# Patient Record
Sex: Female | Born: 1980
Health system: Southern US, Community
[De-identification: ages and names within clinical notes are randomized; demographics above are authoritative.]

## PROBLEM LIST (undated history)

## (undated) DIAGNOSIS — F329 Major depressive disorder, single episode, unspecified: Secondary | ICD-10-CM

## (undated) DIAGNOSIS — Z9189 Other specified personal risk factors, not elsewhere classified: Secondary | ICD-10-CM

## (undated) DIAGNOSIS — R51 Headache: Secondary | ICD-10-CM

## (undated) DIAGNOSIS — B009 Herpesviral infection, unspecified: Secondary | ICD-10-CM

## (undated) DIAGNOSIS — G43909 Migraine, unspecified, not intractable, without status migrainosus: Secondary | ICD-10-CM

## (undated) DIAGNOSIS — F32A Depression, unspecified: Secondary | ICD-10-CM

## (undated) DIAGNOSIS — R519 Headache, unspecified: Secondary | ICD-10-CM

## (undated) HISTORY — DX: Other specified personal risk factors, not elsewhere classified: Z91.89

## (undated) HISTORY — DX: Headache, unspecified: R51.9

## (undated) HISTORY — DX: Herpesviral infection, unspecified: B00.9

## (undated) HISTORY — DX: Major depressive disorder, single episode, unspecified: F32.9

## (undated) HISTORY — DX: Depression, unspecified: F32.A

## (undated) HISTORY — DX: Migraine, unspecified, not intractable, without status migrainosus: G43.909

## (undated) HISTORY — DX: Headache: R51

---

## 2006-05-16 ENCOUNTER — Ambulatory Visit: Payer: Self-pay | Admitting: Obstetrics and Gynecology

## 2006-05-16 ENCOUNTER — Inpatient Hospital Stay (HOSPITAL_COMMUNITY): Admission: AD | Admit: 2006-05-16 | Discharge: 2006-05-16 | Payer: Self-pay | Admitting: Family Medicine

## 2006-06-14 ENCOUNTER — Ambulatory Visit (HOSPITAL_COMMUNITY): Admission: RE | Admit: 2006-06-14 | Discharge: 2006-06-14 | Payer: Self-pay | Admitting: Obstetrics & Gynecology

## 2006-07-15 ENCOUNTER — Inpatient Hospital Stay (HOSPITAL_COMMUNITY): Admission: RE | Admit: 2006-07-15 | Discharge: 2006-07-18 | Payer: Self-pay | Admitting: Obstetrics & Gynecology

## 2007-08-08 IMAGING — US US OB COMP +14 WK
1 series · 13 of 28 positions shown · non-contrast
Comparison: none

CLINICAL DATA: 30 week 6 day gestational age by LMP.  No prenatal care.  Abdominal and pelvic pain.

[Series 1: us ob comp +14 wk · 0.30mm/px · 13 of 48 slices shown]
[im 2/48]
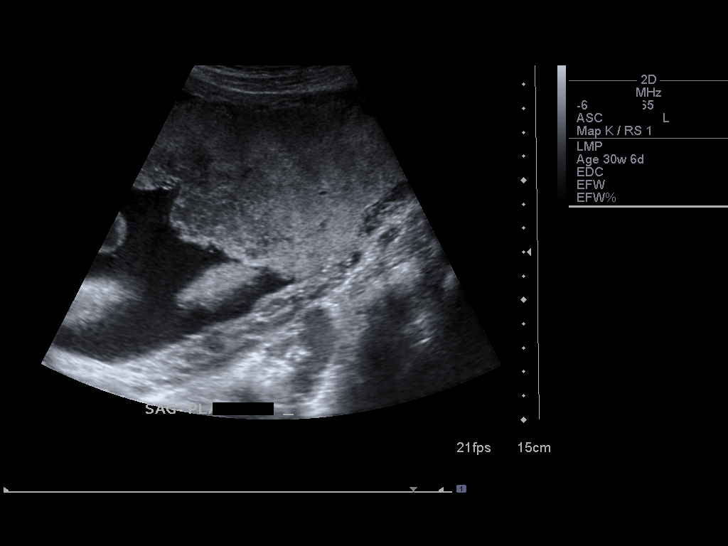
[im 6/48]
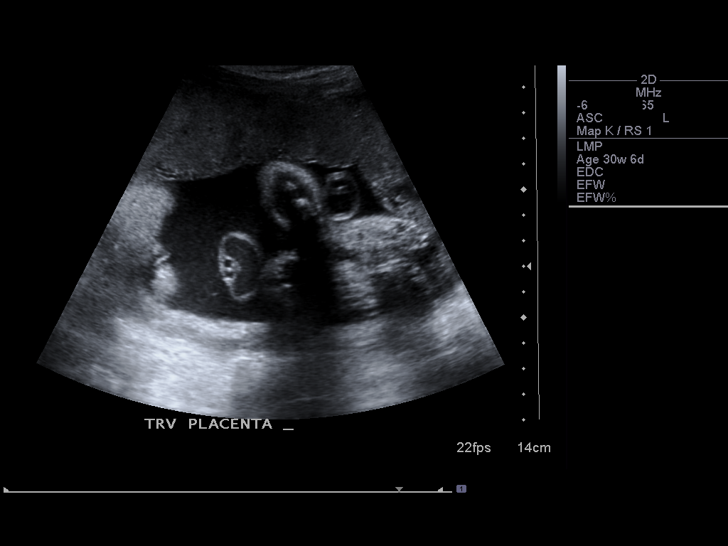
[im 9/48]
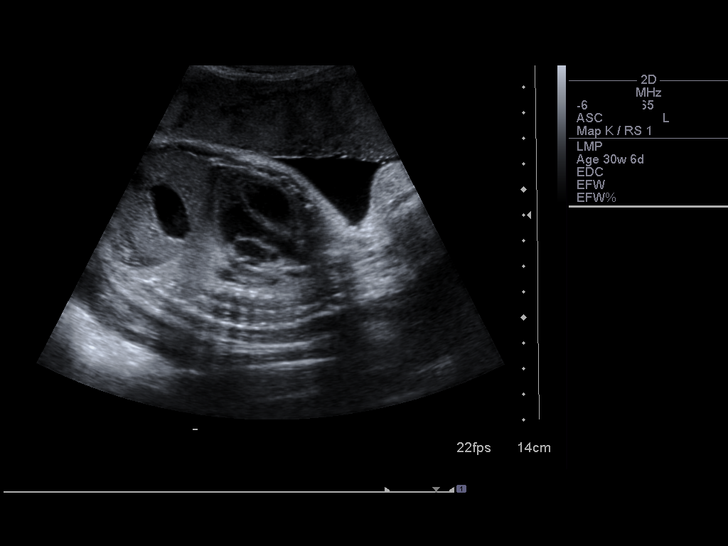
[im 13/48]
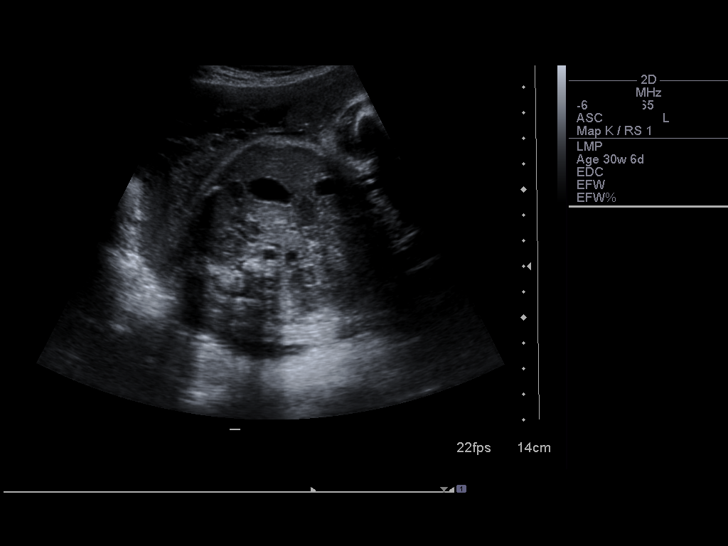
[im 16/48]
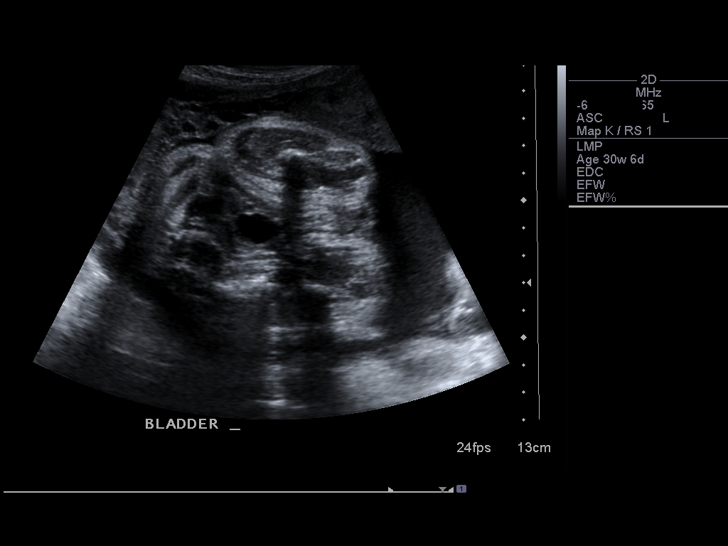
[im 20/48]
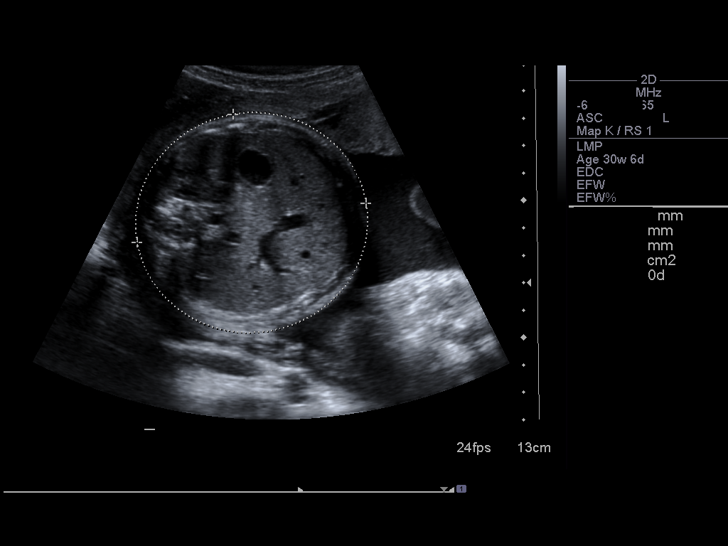
[im 25/48]
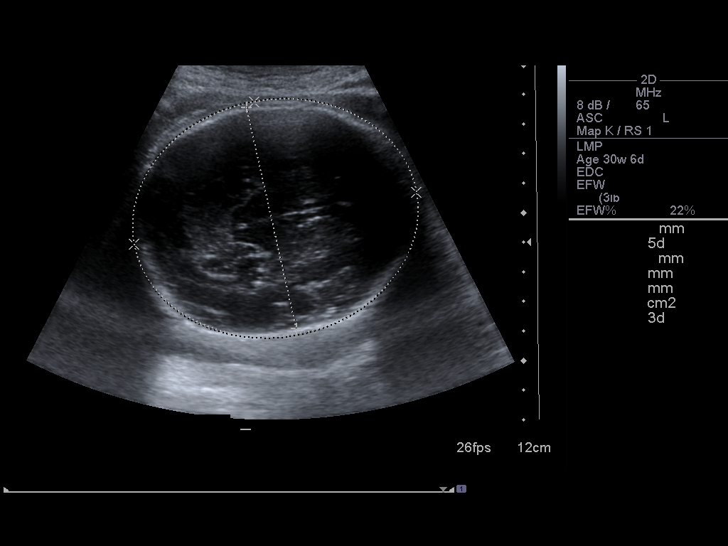
[im 28/48]
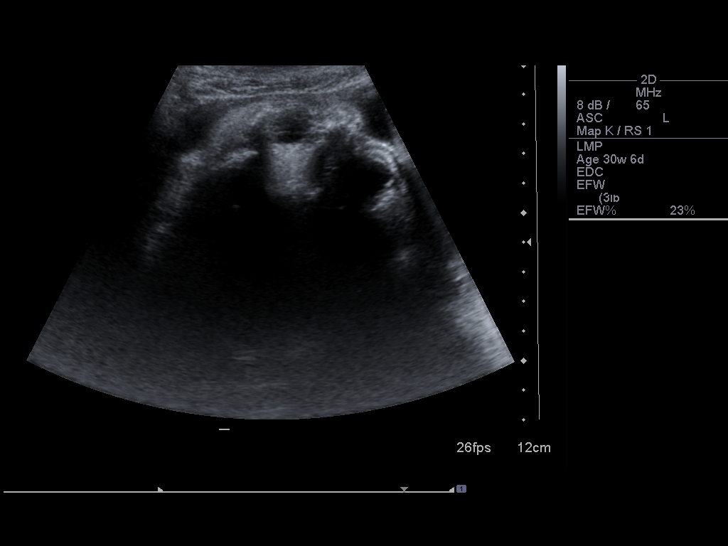
[im 32/48]
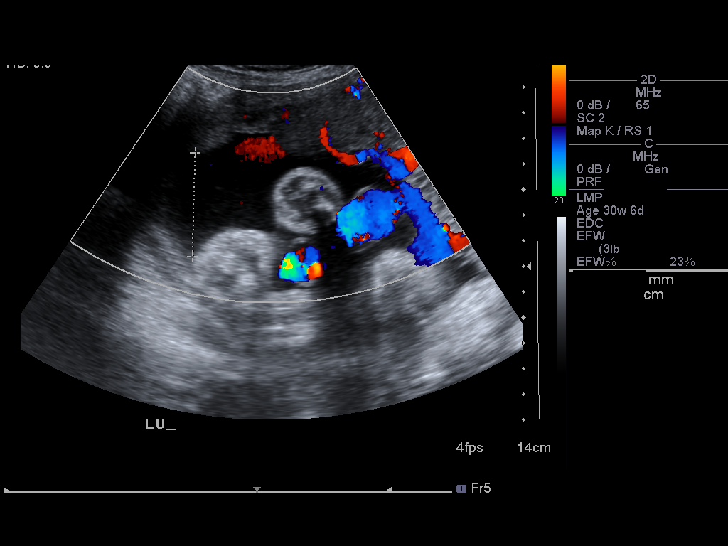
[im 35/48]
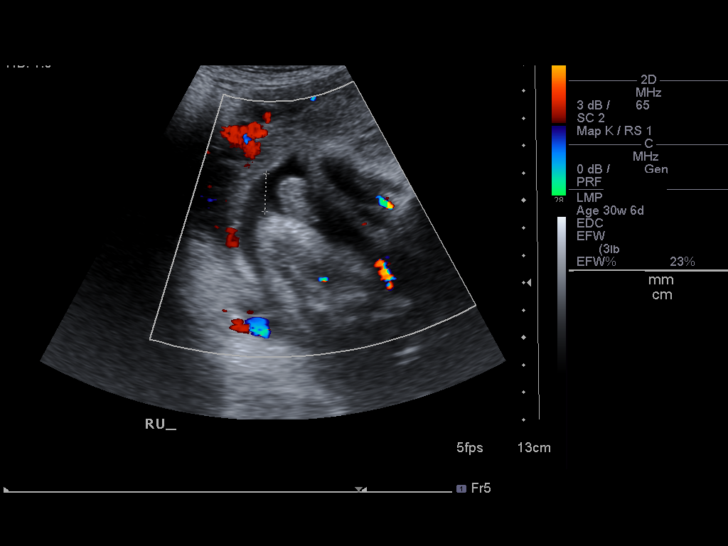
[im 39/48]
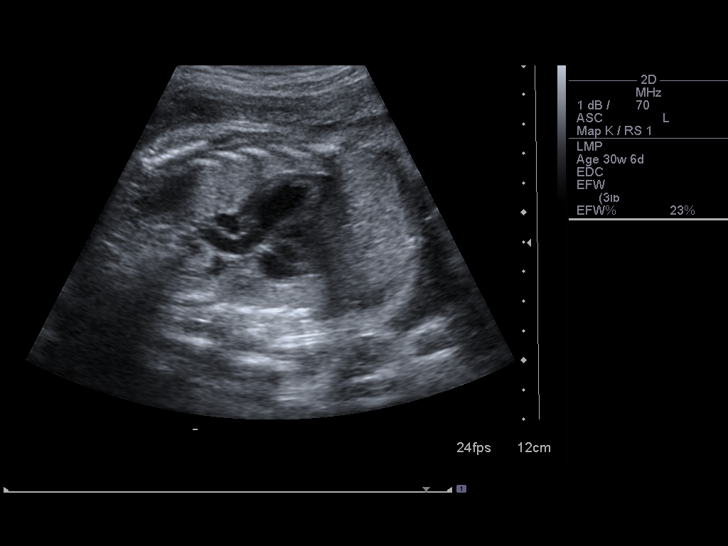
[im 42/48]
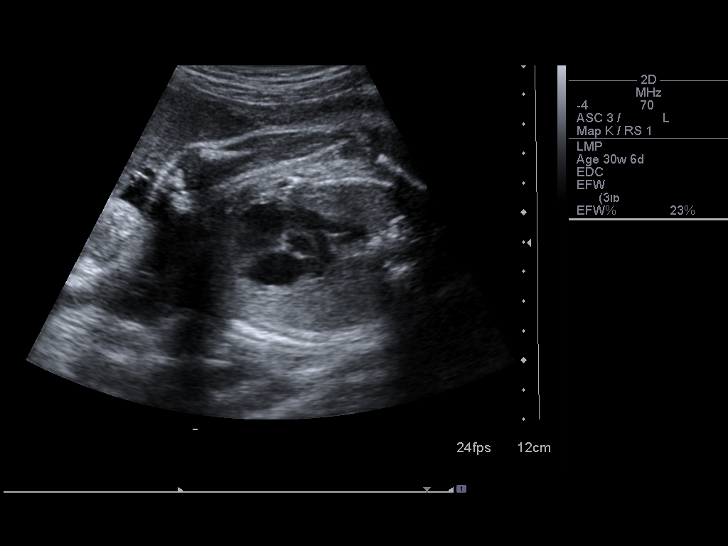
[im 46/48]
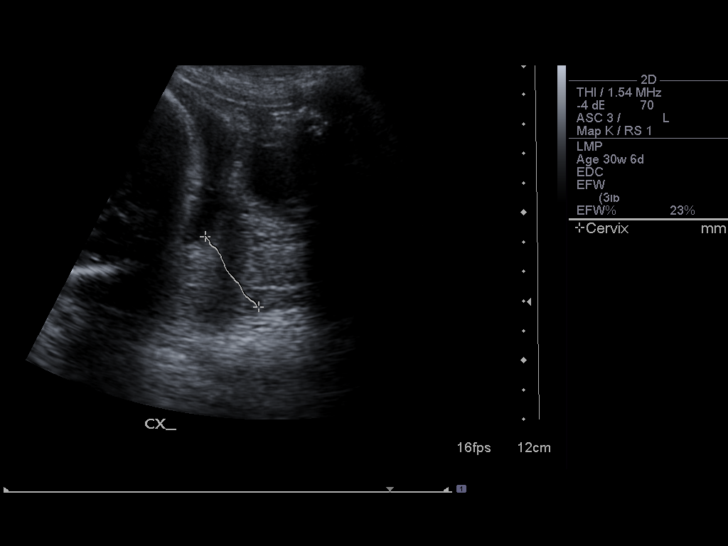

[13 of 28 positions shown; findings below may reference images not displayed]

OBSTETRICAL ULTRASOUND:
 Number of Fetuses:  1
 Heart Rate:  152 bpm
 Movement:  Yes
 Breathing:  Yes
 Presentation:  Cephalic
 Placental Location:  Anterior
 Grade:  I
 Previa:  No
 Amniotic Fluid (Subjective):  Normal
 Amniotic Fluid (Objective):  AFI 13.9 cm (9th-29th %ile = 8.8 to 23.8 cm for 31 weeks) 

 FETAL BIOMETRY
 BPD:  7.7 cm   30 w 6 d 
 HC:  27.9 cm   30 w 4 d 
 AC:  26.0 cm  30 w 1 d 
 FL:  5.8 cm     30 w 4 d 

 MEAN GA:  30 w 4 d   US EDC:  07/21/06

 EFW:  0881 grams 25th ? 50th %ile (3335 ? 1901 g) for 31 weeks 

 FETAL ANATOMY
 Lateral Ventricles:  Visualized 
 Thalami/CSP:  Visualized 
 Posterior Fossa:  Visualized 
 Nuchal Region:  Visualized 
 Spine:  Not visualized 
 4 Chamber Heart on Left:  Visualized 
 Stomach on Left:  Visualized 
 3 Vessel Cord:  Visualized 
 Cord Insertion site:  Visualized 
 Kidneys:  Visualized 
 Bladder:  Visualized 
 Extremities:  Not visualized 

 ADDITIONAL ANATOMY VISUALIZED:  LVOT, RVOT, upper lip, orbits, and diaphragm.  

 Evaluation limited by:  Advanced gestational age and fetal position.

 MATERNAL UTERINE AND ADNEXAL FINDINGS
 Cervix:  3.1 cm transabdominal.
IMPRESSION: 1.  Single living intrauterine fetus with mean gestational age of 30 weeks 4 days and US EDC of 07/21/06.  This is concordant with LMP.  
 2.  Normal amniotic fluid volume.  Normal cervical length.  
 3.  Limited anatomic evaluation due to advanced gestational age; however, no fetal abnormalities identified.

## 2010-02-12 ENCOUNTER — Emergency Department (HOSPITAL_COMMUNITY): Admission: EM | Admit: 2010-02-12 | Discharge: 2010-02-12 | Payer: Self-pay | Admitting: Emergency Medicine

## 2010-06-04 ENCOUNTER — Encounter: Payer: Self-pay | Admitting: Obstetrics & Gynecology

## 2010-09-29 NOTE — Op Note (Signed)
NAMEMarland Kitchen  Ann Rollins, Ann Rollins          ACCOUNT NO.:  [id number]   MEDICAL RECORD NO.:  [id number]          PATIENT TYPE:  INP   LOCATION:  9124                          FACILITY:  [facility]   PHYSICIAN:  Roseanna Rainbow, M.D.DATE OF BIRTH:  11-12-1980   DATE OF PROCEDURE:  07/15/2006  DATE OF DISCHARGE:                               OPERATIVE REPORT   PREOPERATIVE DIAGNOSES:  1. Intrauterine pregnancy at term.  2. History of previous cesarean delivery.   POSTOPERATIVE DIAGNOSES:  1. Intrauterine pregnancy at term.  2. History of previous cesarean delivery.   PROCEDURE:  Repeat cesarean delivery and revision of scar.   SURGEONS:  1. Roseanna Rainbow, M.D.  2. Charles A. Clearance Coots, M.D.   ANESTHESIA:  Spinal.   ESTIMATED BLOOD LOSS:  600 mL.   COMPLICATIONS:  None.   PROCEDURE:  The patient was taken to the operating room with an IV  running.  A spinal anesthetic was placed without difficulty.  She was  then placed in the dorsal supine position with a leftward tilt and  prepped and draped in the usual sterile fashion.  A Pfannenstiel skin  incision was then made through the previous scar with a scalpel and  carried down to the underlying fascia.  The fascia was incised along the  length of the incision with the scalpel.  The superior aspect of the  fascial incision was tented up and the underlying rectus muscles  dissected off.  The inferior aspect of the fascial incision was  manipulated in a similar fashion.  The rectus muscles were separated in  the midline.  The parietal peritoneum was entered sharply and the  peritoneal incision was extended superiorly and inferiorly with good  visualization of bladder.  At this point an Alexis a self-retaining  retractor was placed in the incision.  The vesicouterine peritoneum was  tented up and entered sharply with Metzenbaum scissors.  The bladder  flap was then created sharply.  The lower uterine segment was incised in  a  transverse fashion with the scalpel.  The uterine incision was then  extended bluntly.  The infant's head was delivered atraumatically.  The  oropharynx was suctioned with bulb suction.  Cord was clamped and cut  and the infant was handed off to the awaiting neonatologist.  The  placenta was then removed.  The intrauterine cavity was evacuated of any  remaining amniotic fluid, clots and debris with moistened laparotomy  sponge.  The uterine incision was then reapproximated in a running  interlocking fashion using 0 Monocryl.  A second imbricating layer of  the same was used to obtain adequate hemostasis.  The paracolic gutters  were irrigated.  The retractor was removed.  The parietal peritoneum was  reapproximated in a running fashion using 2-0 Vicryl.  The fascia was  reapproximated in a running fashion using 0 PDS.  At this point the  scarring involving the subcutaneous layer was undermined using the  Bovie.  The skin was then reapproximated  in a running fashion using 3-0 Monocryl.  At the close of procedure, the  instrument and pack counts were said to be  correct x2.  We had given 1  gram of cephazolin at cord clamp.  The patient was taken to the PACU  awake and in stable condition.      Roseanna Rainbow, M.D.  Electronically Signed     LAJ/MEDQ  D:  07/15/2006  T:  07/15/2006  Job:  [id number]

## 2010-09-29 NOTE — Discharge Summary (Signed)
NAMEMarland Kitchen  Ann Rollins, Ann Rollins          ACCOUNT NO.:  [id number]   MEDICAL RECORD NO.:  [id number]          PATIENT TYPE:  INP   LOCATION:  9124                          [facility]ILITY:  [facility]   PHYSICIAN:  Charles A. Clearance Coots, M.D.DATE OF BIRTH:  [birth year]   DATE OF ADMISSION:  07/15/2006  DATE OF DISCHARGE:  07/18/2006                               DISCHARGE SUMMARY   ADMITTING DIAGNOSIS:  Elective repeat cesarean delivery.   DISCHARGE DIAGNOSES:  Elective repeat cesarean delivery, status post  elective repeat low transverse cesarean section on July 15, 2006.  Viable female infant was delivered at 10:39; Apgars of 9 at one minute and  9 at five minutes, weight of 3615 grams, length of 51-1/2 cm.  Mother  and infant discharged home in good condition.   REASON FOR ADMISSION:  A 30 year old female presents for elective repeat  cesarean delivery at term.  Estimated date of confinement was July 22, 2005.  Prenatal care was uncomplicated.   PAST MEDICAL HISTORY:  Surgery:  Cesarean section 2002.  Illnesses:  None.   MEDICATIONS:  Prenatal vitamins.  Claritin.   ALLERGIES:  No known drug allergies.   SOCIAL HISTORY:  Single.  Negative for tobacco, alcohol or recreational  drug use.   PHYSICAL EXAMINATION:  Well-nourished, well-developed female in no acute  distress.  LUNGS:  Were clear to auscultation bilaterally.  HEART:  Regular rate and rhythm.  ABDOMEN:  Gravid, nontender.  Cervix long, closed.  Vertex at a -3.   ADMITTING LABORATORY VALUES:  Hemoglobin 8.9, hematocrit 27.4, white  blood cell count 6000, platelets 220,000.  ABO:  Rh was O+.  RPR was  nonreactive.   HOSPITAL COURSE:  The patient underwent a repeat low transverse cesarean  section on July 15, 2006.  There were no intraoperative complications.  Postoperative course was uncomplicated.  The patient was discharged home  on postoperative day #3 in good condition.  The patient did have  postoperative anemia which was  clinically very stable without any  orthostatic blood pressure changes with ambulation.  She had a history  of chronic anemia with baseline hemoglobin of 8.9 prior to surgery,  probable iron deficiency anemia.   DISCHARGE LABORATORY VALUES:  Hemoglobin 6.1, hematocrit 18.5, white  blood cell count 7200, platelets 172,000.   DISCHARGE DISPOSITION:  MEDICATIONS:  Tylox and ibuprofen was prescribed  for pain.  Continue prenatal vitamins.  Iron was prescribed for anemia.   Routine written instructions were given for discharge after cesarean  section.  The patient was to call the office for a follow-up appointment  in 2 weeks.      Charles A. Clearance Coots, M.D.  Electronically Signed     CAH/MEDQ  D:  08/27/2006  T:  08/27/2006  Job:  [id number]

## 2014-11-21 ENCOUNTER — Emergency Department (HOSPITAL_BASED_OUTPATIENT_CLINIC_OR_DEPARTMENT_OTHER)
Admission: EM | Admit: 2014-11-21 | Discharge: 2014-11-21 | Disposition: A | Discharge status: Home / Self Care | Payer: 59 | Attending: Emergency Medicine | Admitting: Emergency Medicine

## 2014-11-21 ENCOUNTER — Encounter (HOSPITAL_BASED_OUTPATIENT_CLINIC_OR_DEPARTMENT_OTHER): Payer: Self-pay | Admitting: Emergency Medicine

## 2014-11-21 DIAGNOSIS — H578 Other specified disorders of eye and adnexa: Secondary | ICD-10-CM | POA: Diagnosis present

## 2014-11-21 DIAGNOSIS — R05 Cough: Secondary | ICD-10-CM | POA: Insufficient documentation

## 2014-11-21 DIAGNOSIS — R0982 Postnasal drip: Secondary | ICD-10-CM | POA: Insufficient documentation

## 2014-11-21 DIAGNOSIS — H109 Unspecified conjunctivitis: Secondary | ICD-10-CM | POA: Diagnosis not present

## 2014-11-21 DIAGNOSIS — R0981 Nasal congestion: Secondary | ICD-10-CM | POA: Diagnosis not present

## 2014-11-21 MED ORDER — ERYTHROMYCIN 5 MG/GM OP OINT
TOPICAL_OINTMENT | Freq: Three times a day (TID) | OPHTHALMIC | Status: DC
  Administered 2014-11-21: 1 via OPHTHALMIC
  Filled 2014-11-21: qty 3.5

## 2014-11-21 MED ORDER — OXYMETAZOLINE HCL 0.05 % NA SOLN
1.0000 | Freq: Once | NASAL | Status: AC
  Administered 2014-11-21: 1 via NASAL
  Filled 2014-11-21: qty 15

## 2014-11-21 MED ORDER — TETRACAINE HCL 0.5 % OP SOLN
2.0000 [drp] | Freq: Once | OPHTHALMIC | Status: AC
  Administered 2014-11-21: 2 [drp] via OPHTHALMIC
  Filled 2014-11-21: qty 2

## 2014-11-21 MED ORDER — FLUORESCEIN SODIUM 1 MG OP STRP
1.0000 | ORAL_STRIP | Freq: Once | OPHTHALMIC | Status: AC
  Administered 2014-11-21: 1 via OPHTHALMIC
  Filled 2014-11-21: qty 1

## 2014-11-21 NOTE — ED Provider Notes (Signed)
CSN: [id number]     Arrival date & time 11/21/14  1457 History  This chart was scribed for Margarita Grizzleanielle Dillan Lunden, MD by Abel PrestoKara Demonbreun, ED Scribe. This patient was seen in room MHFT1/MHFT1 and the patient's care was started at 4:39 PM.    Chief Complaint  Patient presents with  . Eye Drainage     The history is provided by the patient. No language interpreter was used.   HPI Comments: Ann Rollins is a 34 y.o. female who presents to the Emergency Department complaining of right eye drainage, itching, and redness with onset last night. She states she used an old eyeliner 2 days ago which was previously used by family member. She notes associated swelling. She also presents with sinus congestion with green mucus production with onset 3 days ago and post nasal drainage with secondary mild cough. She has tried Benadryl and Nyquil with mild relief.  Pt wears glasses but does not wear glasses. Pt's LNMP was 11/07/14. She is unsure if she is pregnant. Pt is not a smoker. She denies vision loss, fever, chills, and sore throat.   History reviewed. No pertinent past medical history. History reviewed. No pertinent past surgical history. History reviewed. No pertinent family history. History  Substance Use Topics  . Smoking status: Never Smoker   . Smokeless tobacco: Not on file  . Alcohol Use: No   OB History    No data available     Review of Systems  Constitutional: Negative for fever and chills.  HENT: Positive for congestion and postnasal drip. Negative for sore throat.   Eyes: Positive for discharge, redness and itching. Negative for visual disturbance.  Respiratory: Positive for cough.   All other systems reviewed and are negative.     Allergies  Claritin  Home Medications   Prior to Admission medications   Not on File   BP 147/88 mmHg  Pulse 81  Temp(Src) 98.4 F (36.9 C) (Oral)  Resp 16  Ht 5\' 3"  (1.6 m)  Wt 170 lb (77.111 kg)  BMI 30.12 kg/m2  SpO2 100%  LMP  11/11/2014 Physical Exam  Constitutional: She is oriented to person, place, and time. She appears well-developed and well-nourished.  HENT:  Head: Normocephalic.  Eyes: EOM are normal. Pupils are equal, round, and reactive to light. Right conjunctiva is injected.  Slit lamp exam:      The right eye shows no corneal abrasion.  Extraocular pressure:16  Neck: Normal range of motion. Neck supple.  Pulmonary/Chest: Effort normal.  Musculoskeletal: Normal range of motion.  Neurological: She is alert and oriented to person, place, and time.  Skin: Skin is warm and dry.  Psychiatric: She has a normal mood and affect. Her behavior is normal.  Nursing note and vitals reviewed.   ED Course  Procedures (including critical care time) DIAGNOSTIC STUDIES: Oxygen Saturation is 100% on room air, normal by my interpretation.    COORDINATION OF CARE: 4:50 PM Discussed treatment plan with patient at beside, the patient agrees with the plan and has no further questions at this time.   Labs Review Labs Reviewed - No data to display  Imaging Review No results found.   EKG Interpretation None      MDM   Final diagnoses:  Conjunctivitis of right eye    I personally performed the services described in this documentation, which was scribed in my presence. The recorded information has been reviewed and considered.  Margarita Grizzleanielle Rube Sanchez, MD 11/22/14 [phone]

## 2014-11-21 NOTE — ED Notes (Signed)
Patient reports that she started to have pain and reddness to her right eye this am after having sinus and allergie problems

## 2014-11-21 NOTE — Discharge Instructions (Signed)

## 2017-04-22 ENCOUNTER — Ambulatory Visit: Payer: 59 | Admitting: Family Medicine

## 2017-05-20 ENCOUNTER — Ambulatory Visit (INDEPENDENT_AMBULATORY_CARE_PROVIDER_SITE_OTHER): Payer: 59 | Admitting: Family Medicine

## 2017-05-20 ENCOUNTER — Encounter: Payer: Self-pay | Admitting: Family Medicine

## 2017-05-20 VITALS — BP 122/78 | HR 77 | Temp 98.5°F | Resp 12 | Ht 63.0 in | Wt 184.0 lb

## 2017-05-20 DIAGNOSIS — Z6832 Body mass index (BMI) 32.0-32.9, adult: Secondary | ICD-10-CM

## 2017-05-20 DIAGNOSIS — L309 Dermatitis, unspecified: Secondary | ICD-10-CM

## 2017-05-20 DIAGNOSIS — Z131 Encounter for screening for diabetes mellitus: Secondary | ICD-10-CM

## 2017-05-20 DIAGNOSIS — E669 Obesity, unspecified: Secondary | ICD-10-CM | POA: Insufficient documentation

## 2017-05-20 DIAGNOSIS — E6609 Other obesity due to excess calories: Secondary | ICD-10-CM

## 2017-05-20 DIAGNOSIS — R5383 Other fatigue: Secondary | ICD-10-CM

## 2017-05-20 DIAGNOSIS — D509 Iron deficiency anemia, unspecified: Secondary | ICD-10-CM

## 2017-05-20 MED ORDER — TRIAMCINOLONE ACETONIDE 0.5 % EX OINT: 1.0000 "application " | TOPICAL_OINTMENT | Freq: Two times a day (BID) | CUTANEOUS | 1 refills | Status: AC

## 2017-05-20 NOTE — Progress Notes (Signed)
HPI:   Ann Rollins is a 37 y.o. female, who is here today to establish care.  Former PCP: N/A Last preventive routine visit: 08/2016, she had her gyn exam.  Chronic medical problems: Allergic rhinitis,obesity, and recurrent genital herpes (follows with gyn). She does not exercise regularly and does not follow a healthy diet consistently.   Concerns today:   Pruritic rash on upper extremity, which has resolved and now with post inflammatory pigmentation changes. She noted rash initially around July last year. She has used OTC Cetaphil. She is not sure about exacerbating or alleviating factors. She denies associated hair loss, oral lesions, joint pain, or myalgias.   She does not have history of eczema.  Fatigue: She would like something for energy. This is a chronic problem, she is reporting history of iron deficiency anemia. Currently she is not on iron supplementation because it causes constipation. IUD/Mirena 04/2016. LMP 3 weeks ago. She denies gross hematuria, blood in the stool, or melena.  06/13/2015  HGB 7.8 (L) 12.0 - 15.0 g/dL [facility]  HCT 24.7 (L) 36.0 - 46.0 % [facility]   She has not noted dyspnea, chest pain, diaphoresis, dizziness, or pica.  No history of insomnia, sleeps about 6-8 hours.  She has history of seasonal allergies. Past history of depression, she denies symptoms.  She lives with her 3 children. Her father recommended OTC supplement to increase energy level,she doe snot recall name and has not started yet.  Review of Systems  Constitutional: Positive for fatigue. Negative for activity change, appetite change, fever and unexpected weight change.  HENT: Negative for mouth sores, nosebleeds and trouble swallowing.   Eyes: Negative for redness and visual disturbance.  Respiratory: Negative for cough, shortness of breath and wheezing.   Cardiovascular: Negative for chest pain, palpitations and  leg swelling.  Gastrointestinal: Negative for abdominal pain, nausea and vomiting.       Negative for changes in bowel habits.  Endocrine: Negative for cold intolerance, heat intolerance, polydipsia, polyphagia and polyuria.  Genitourinary: Negative for decreased urine volume, dysuria and hematuria.  Musculoskeletal: Negative for arthralgias, gait problem and myalgias.  Skin: Positive for rash. Negative for pallor and wound.  Allergic/Immunologic: Positive for environmental allergies.  Neurological: Negative for syncope, weakness and headaches.  Hematological: Negative for adenopathy. Does not bruise/bleed easily.  Psychiatric/Behavioral: Negative for confusion. The patient is nervous/anxious.       Current Outpatient Medications on File Prior to Visit  Medication Sig Dispense Refill  . levonorgestrel (MIRENA) 20 MCG/24HR IUD by Intrauterine route.    . valACYclovir (VALTREX) 500 MG tablet Take 500 mg by mouth.     No current facility-administered medications on file prior to visit.      Past Medical History:  Diagnosis Date  . Depression   . Frequent headaches   . Herpes simplex   . History of fainting spells of unknown cause   . Migraines    Allergies  Allergen Reactions  . Claritin [Loratadine] Hives    Family History  Problem Relation Age of Onset  . Hyperlipidemia Mother   . Mental illness Mother   . Narcolepsy Mother   . Depression Father   . Hyperlipidemia Father   . Hypertension Father   . Mental illness Father   . Depression Brother   . Mental illness Brother   . ADD / ADHD Brother   . ADD / ADHD Son   . Alcohol abuse Maternal Grandfather   .  Hearing loss Maternal Grandfather   . Heart disease Maternal Grandfather   . Hyperlipidemia Maternal Grandfather   . Hypertension Maternal Grandfather   . Kidney disease Maternal Grandfather   . Cancer Paternal Grandmother   . Breast cancer Paternal Grandmother   . Heart disease Paternal Grandfather   .  Hyperlipidemia Paternal Grandfather   . Hypertension Paternal Grandfather   . Heart attack Paternal Grandfather     Social History   Socioeconomic History  . Marital status: Married    Spouse name: None  . Number of children: None  . Years of education: None  . Highest education level: None  Social Needs  . Financial resource strain: None  . Food insecurity - worry: None  . Food insecurity - inability: None  . Transportation needs - medical: None  . Transportation needs - non-medical: None  Occupational History  . None  Tobacco Use  . Smoking status: Former Smoker    Types: Cigarettes  . Smokeless tobacco: Never Used  Substance and Sexual Activity  . Alcohol use: Yes  . Drug use: No  . Sexual activity: None  Other Topics Concern  . None  Social History Narrative  . None    Vitals:   05/20/17 1636  BP: 122/78  Pulse: 77  Resp: 12  Temp: 98.5 F (36.9 C)  SpO2: 98%    Body mass index is 32.59 kg/m.   Physical Exam  Nursing note and vitals reviewed. Constitutional: She is oriented to person, place, and time. She appears well-developed. No distress.  HENT:  Head: Normocephalic and atraumatic.  Mouth/Throat: Oropharynx is clear and moist and mucous membranes are normal.  Eyes: Conjunctivae are normal. Pupils are equal, round, and reactive to light.  Neck: No tracheal deviation present. No thyroid mass and no thyromegaly (palpable.) present.  Cardiovascular: Normal rate and regular rhythm.  No murmur heard. Pulses:      Dorsalis pedis pulses are 2+ on the right side, and 2+ on the left side.  Respiratory: Effort normal and breath sounds normal. No respiratory distress.  GI: Soft. She exhibits no mass. There is no hepatomegaly. There is no tenderness.  Musculoskeletal: She exhibits no edema or tenderness.  Lymphadenopathy:    She has no cervical adenopathy.  Neurological: She is alert and oriented to person, place, and time. She has normal strength.  Coordination normal.  Skin: Skin is warm. Rash noted. No erythema.  Hyperpigmented macular lesions scattered on  dorsum of the hand and fingers, bilateral.   Dorsal aspect of right forearm right after elbow, 4 cm diameter. No erythematous lesions,no tenderness.  Psychiatric: She has a normal mood and affect.  Well groomed, good eye contact.    ASSESSMENT AND PLAN:   Ms. Karington was seen today for establish care.  Diagnoses and all orders for this visit:  Fatigue, unspecified type  We discussed possible etiologies: Systemic illness, immunologic,endocrinology,sleep disorder, psychiatric/psychologic, infectious,medications side effects, and idiopathic. Examination today does not suggest a serious process. Healthy diet and regular physical activity may help.  Further recommendations will be given according to lab results.  -     Basic metabolic panel -     VITAMIN D 25 Hydroxy (Vit-D Deficiency, Fractures) -     TSH -     T4, free -     T3, free -     Hemoglobin A1c  Eczema, unspecified type  Acute episode has resolved, residual postinflammatory hyperpigmentation. We discussed other possible etiologies. Triamcinolone topical as needed bid.  Side effects of topical steroid reviewed.  -     triamcinolone ointment (KENALOG) 0.5 %; Apply 1 application topically 2 (two) times daily.  Diabetes mellitus screening -     Hemoglobin A1c  Class 1 obesity due to excess calories without serious comorbidity with body mass index (BMI) of 32.0 to 32.9 in adult  We discussed benefits of wt loss as well as adverse effects of obesity. Consistency with healthy diet and physical activity recommended.  -     TSH -     Hemoglobin A1c  Iron deficiency anemia, unspecified iron deficiency anemia type  Further recommendations will be given according to lab results. This problem could explain her chronic fatigue. Instructed about warning signs. We will arrange follow-up depending on lab  results.  -     CBC with Differential/Platelet      Kaire Stary G. SwazilandJordan, MD  [facility]. [place] office.

## 2017-05-20 NOTE — Patient Instructions (Addendum)
A few things to remember from today's visit:   Fatigue, unspecified type - Plan: Basic metabolic panel, VITAMIN D 25 Hydroxy (Vit-D Deficiency, Fractures), TSH, T4, free, T3, free, Hemoglobin A1c  Eczema, unspecified type - Plan: triamcinolone ointment (KENALOG) 0.5 %  It is a common symptom associated with multiple factors: psychologic,medications, systemic illness, sleep disorders,infections, and unknown causes. Some work-up can be done to evaluate for common causes as thyroid disease,anemia,diabetes, or abnormalities in calcium,potassium,or sodium. Regular physical activity as tolerated and a healthy diet is usually might help and usually recommended for chronic fatigue.   Please be sure medication list is accurate. If a new problem present, please set up appointment sooner than planned today.

## 2017-05-21 LAB — CBC WITH DIFFERENTIAL/PLATELET
BASOS PCT: 0.5 % (ref 0.0–3.0)
Basophils Absolute: 0 10*3/uL (ref 0.0–0.1)
EOS ABS: 0.1 10*3/uL (ref 0.0–0.7)
Eosinophils Relative: 1.6 % (ref 0.0–5.0)
HCT: 39.1 % (ref 36.0–46.0)
Hemoglobin: 12.9 g/dL (ref 12.0–15.0)
LYMPHS ABS: 2.3 10*3/uL (ref 0.7–4.0)
Lymphocytes Relative: 37.6 % (ref 12.0–46.0)
MCHC: 32.9 g/dL (ref 30.0–36.0)
MCV: 93.8 fl (ref 78.0–100.0)
MONO ABS: 0.5 10*3/uL (ref 0.1–1.0)
Monocytes Relative: 7.6 % (ref 3.0–12.0)
NEUTROS ABS: 3.3 10*3/uL (ref 1.4–7.7)
NEUTROS PCT: 52.7 % (ref 43.0–77.0)
PLATELETS: 277 10*3/uL (ref 150.0–400.0)
RBC: 4.17 Mil/uL (ref 3.87–5.11)
RDW: 12.1 % (ref 11.5–15.5)
WBC: 6.2 10*3/uL (ref 4.0–10.5)

## 2017-05-21 LAB — BASIC METABOLIC PANEL
BUN: 14 mg/dL (ref 6–23)
CALCIUM: 9.6 mg/dL (ref 8.4–10.5)
CO2: 25 meq/L (ref 19–32)
Chloride: 103 mEq/L (ref 96–112)
Creatinine, Ser: 0.73 mg/dL (ref 0.40–1.20)
GFR: 116.81 mL/min (ref >60.00)
Glucose, Bld: 85 mg/dL (ref 70–99)
Potassium: 3.8 mEq/L (ref 3.5–5.1)
SODIUM: 138 meq/L (ref 135–145)

## 2017-05-21 LAB — T4, FREE: FREE T4: 0.61 ng/dL (ref 0.60–1.60)

## 2017-05-21 LAB — HEMOGLOBIN A1C: Hgb A1c MFr Bld: 5.3 % (ref 4.6–6.5)

## 2017-05-21 LAB — TSH: TSH: 1.09 u[IU]/mL (ref 0.35–4.50)

## 2017-05-21 LAB — VITAMIN D 25 HYDROXY (VIT D DEFICIENCY, FRACTURES): VITD: 9 ng/mL — AB (ref 30.00–100.00)

## 2017-05-21 LAB — T3, FREE: T3, Free: 4.1 pg/mL (ref 2.3–4.2)

## 2017-05-22 ENCOUNTER — Other Ambulatory Visit: Payer: Self-pay | Admitting: *Deleted

## 2017-05-22 MED ORDER — VITAMIN D (ERGOCALCIFEROL) 1.25 MG (50000 UNIT) PO CAPS: 50000.0000 [IU] | ORAL_CAPSULE | ORAL | 0 refills | Status: AC

## 2017-08-28 ENCOUNTER — Telehealth: Payer: Self-pay | Admitting: Family Medicine

## 2017-08-28 NOTE — Telephone Encounter (Signed)
Pt requesting refill of Valacyclovir(Valtrex) 500 mg tab. Previously filled by another provider.   LOV: 05/20/17  PCP: Dr. SwazilandJordan  [facility]   [address]

## 2017-08-28 NOTE — Telephone Encounter (Signed)
Copied from CRM [id number]. Topic: Quick Communication - See Telephone Encounter >> Aug 28, 2017  3:24 PM Windy KalataMichael, Marysue Fait L, NT wrote: CRM for notification. See Telephone encounter for: 08/28/17.  Patient is requesting a refill on valACYclovir (VALTREX) 500 MG tablet   [facility] [address] [place] [address] [place] [address] Phone: [phone] Fax: [phone]

## 2017-08-29 NOTE — Telephone Encounter (Signed)
Please advise. We have not filled this for patient before.

## 2017-08-29 NOTE — Telephone Encounter (Signed)
She may has received Rx from her gyn, so she can call her gyn's office.  Thanks, BJ

## 2017-09-03 NOTE — Telephone Encounter (Signed)
Spoke with patient and she stated that she called gyn office and they refilled medication for her. No further assistance needed at this time.

## 2019-09-04 ENCOUNTER — Ambulatory Visit: Payer: 59 | Attending: Internal Medicine

## 2019-09-04 DIAGNOSIS — Z23 Encounter for immunization: Secondary | ICD-10-CM

## 2019-09-04 NOTE — Progress Notes (Signed)
   Covid-19 Vaccination Clinic  Name:  Ann Rollins    MRN: [id number] DOB: 1980-07-20  09/04/2019  [name] was observed post Covid-19 immunization for 15 minutes without incident. She was provided with Vaccine Information Sheet and instruction to access the V-Safe system.   [name] was instructed to call 911 with any severe reactions post vaccine: Marland Kitchen Difficulty breathing  . Swelling of face and throat  . A fast heartbeat  . A bad rash all over body  . Dizziness and weakness   Immunizations Administered    Name Date Dose VIS Date Route   Pfizer COVID-19 Vaccine 09/04/2019  8:04 AM 0.3 mL 07/08/2018 Intramuscular   Manufacturer: [employer], [employer]   Lot: [id number]   NDC: [address]-0

## 2019-09-28 ENCOUNTER — Ambulatory Visit: Payer: 59 | Attending: Internal Medicine

## 2019-09-28 DIAGNOSIS — Z23 Encounter for immunization: Secondary | ICD-10-CM

## 2019-09-28 NOTE — Progress Notes (Signed)
   Covid-19 Vaccination Clinic  Name:  Roberta Angell    MRN: [id number] DOB: 1980-10-13  09/28/2019  Ms. Dippolito was observed post Covid-19 immunization for 15 minutes without incident. She was provided with Vaccine Information Sheet and instruction to access the V-Safe system.   Ms. Scavone was instructed to call 911 with any severe reactions post vaccine: Marland Kitchen Difficulty breathing  . Swelling of face and throat  . A fast heartbeat  . A bad rash all over body  . Dizziness and weakness   Immunizations Administered    Name Date Dose VIS Date Route   Pfizer COVID-19 Vaccine 09/28/2019  8:22 AM 0.3 mL 07/08/2018 Intramuscular   Manufacturer: [employer], [employer]   Lot: [id number]   NDC: [address]-5
